# Patient Record
Sex: Female | Born: 2000 | Race: White | Hispanic: No | Marital: Single | Smoking: Never smoker
Health system: Southern US, Community
[De-identification: ages and names within clinical notes are randomized; demographics above are authoritative.]

## PROBLEM LIST (undated history)

## (undated) DIAGNOSIS — R519 Headache, unspecified: Secondary | ICD-10-CM

## (undated) DIAGNOSIS — R51 Headache: Secondary | ICD-10-CM

## (undated) HISTORY — DX: Headache: R51

## (undated) HISTORY — DX: Headache, unspecified: R51.9

---
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
## 2023-02-20 ENCOUNTER — Emergency Department: Payer: Self-pay

## 2023-02-20 ENCOUNTER — Other Ambulatory Visit: Payer: Self-pay

## 2023-02-20 ENCOUNTER — Emergency Department
Admission: EM | Admit: 2023-02-20 | Discharge: 2023-02-20 | Disposition: A | Discharge status: Home / Self Care | Payer: Self-pay | Attending: Emergency Medicine | Admitting: Emergency Medicine

## 2023-02-20 DIAGNOSIS — M79605 Pain in left leg: Secondary | ICD-10-CM | POA: Diagnosis present

## 2023-02-20 DIAGNOSIS — Y990 Civilian activity done for income or pay: Secondary | ICD-10-CM | POA: Diagnosis not present

## 2023-02-20 DIAGNOSIS — W228XXA Striking against or struck by other objects, initial encounter: Secondary | ICD-10-CM | POA: Diagnosis not present

## 2023-02-20 DIAGNOSIS — Z23 Encounter for immunization: Secondary | ICD-10-CM | POA: Diagnosis not present

## 2023-02-20 DIAGNOSIS — S80812A Abrasion, left lower leg, initial encounter: Secondary | ICD-10-CM | POA: Diagnosis not present

## 2023-02-20 DIAGNOSIS — S8992XA Unspecified injury of left lower leg, initial encounter: Secondary | ICD-10-CM

## 2023-02-20 MED ORDER — TETANUS-DIPHTH-ACELL PERTUSSIS 5-2.5-18.5 LF-MCG/0.5 IM SUSY
0.5000 mL | PREFILLED_SYRINGE | Freq: Once | INTRAMUSCULAR | Status: AC
  Administered 2023-02-20: 0.5 mL via INTRAMUSCULAR
  Filled 2023-02-20: qty 0.5

## 2023-02-20 NOTE — ED Provider Notes (Signed)
[facility] Provider Note    Event Date/Time   First MD Initiated Contact with Patient 02/20/23 2208     (approximate)   History   Leg Injury and Workers Comp   HPI Shiela Zavalza is a 22 y.o. female presenting today for left leg pain.  Patient states being at work when she accidentally stepped off a trailer while holding a hay barrel and hit her left shin on an object.  She noted an abrasion with pain to the middle of her left shin.  Denies injury elsewhere.  No numbness or tingling.  Has been able to walk with a limp on it.  Concern for fracture.     Physical Exam   Triage Vital Signs: ED Triage Vitals  Encounter Vitals Group     BP 02/20/23 2057 122/84     Systolic BP Percentile --      Diastolic BP Percentile --      Pulse Rate 02/20/23 2057 82     Resp 02/20/23 2057 17     Temp 02/20/23 2057 98.2 F (36.8 C)     Temp Source 02/20/23 2057 Oral     SpO2 02/20/23 2057 97 %     Weight 02/20/23 2054 155 lb (70.3 kg)     Height 02/20/23 2054 5\' 3"  (1.6 m)     Head Circumference --      Peak Flow --      Pain Score 02/20/23 2054 4     Pain Loc --      Pain Education --      Exclude from Growth Chart --     Most recent vital signs: Vitals:   02/20/23 2057  BP: 122/84  Pulse: 82  Resp: 17  Temp: 98.2 F (36.8 C)  SpO2: 97%   I have reviewed the vital signs. General:  Awake, alert, no acute distress. Head:  Normocephalic, Atraumatic. EENT:  PERRL, EOMI, Oral mucosa pink and moist, Neck is supple. Cardiovascular: Regular rate, 2+ distal pulses. Respiratory:  Normal respiratory effort, symmetrical expansion, no distress.   Extremities:  Moving all four extremities through full ROM without pain.  Mild mid tibia edema with slight tenderness to palpation Neuro:  Alert and oriented.  Interacting appropriately.   Skin:  Warm, dry, no rash.  Abrasion to mid left tibia with some surrounding soft tissue skin abrasion.  No open  lacerations. Psych: Appropriate affect.    ED Results / Procedures / Treatments   Labs (all labs ordered are listed, but only abnormal results are displayed) Labs Reviewed - No data to display   EKG    RADIOLOGY Independently interpreted x-ray of left tibia with no acute fractures   PROCEDURES:  Critical Care performed: No  Procedures   MEDICATIONS ORDERED IN ED: Medications  Tdap (BOOSTRIX) injection 0.5 mL (0.5 mLs Intramuscular Given 02/20/23 2237)     IMPRESSION / MDM / ASSESSMENT AND PLAN / ED COURSE  I reviewed the triage vital signs and the nursing notes.                              Differential diagnosis includes, but is not limited to, left shin abrasion, less likely mid tibial fracture  Patient's presentation is most consistent with acute complicated illness / injury requiring diagnostic workup.  Patient is a 22 year old female presenting today for fall with left leg injury.  Evidence of abrasion to the left shin with  some tibial edema but no obvious deformity.  No other injury elsewhere.  Neurovascular intact throughout.  She is unsure of her last tetanus shot do not see in our system so tetanus shot was updated today.  X-ray performed of the left tibia.  No acute evidence of fracture noted.     FINAL CLINICAL IMPRESSION(S) / ED DIAGNOSES   Final diagnoses:  Abrasion of left lower extremity, initial encounter  Injury of left lower extremity, initial encounter     Rx / DC Orders   ED Discharge Orders     None        Note:  This document was prepared using Dragon voice recognition software and may include unintentional dictation errors.   Janith Lima, MD 02/20/23 [phone]

## 2023-02-20 NOTE — ED Notes (Signed)
Pt to ED related to [facility] injury. Supplies gathered, process explained to PT, pt presented her ID, pt gave verbal consent and paper work was completed. Hand hygiene performed prior to donning gloves. Pt instructed to take the swab and place under her tongue and not to chew, suck or talk with swab in her mouth. Once blue line was visualized, I donned a clean set of gloves and removed swab from PT mouth and placed it in the specimen container. Swab was placed in the bag with the chain of custody form and sealed. 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

## 2023-02-20 NOTE — ED Triage Notes (Signed)
Pt to ED via POV c/o left lower leg injury. Pt was walking off trailer when she fell between trailer and loading platform, putting all her weight on lkeft leg. Pt has some bruising, swelling, and abrasion to left shin. Pt able to bear weight. Wanting to file WC.

## 2023-02-20 NOTE — ED Notes (Signed)
Patient given discharge instructions including importance of follow up appt as needed with stated understanding. Patient stable and ambulatory with steady even gait on dispo.

## 2023-02-20 NOTE — Discharge Instructions (Addendum)
Please return for any worsening symptoms or inability to stand on the leg.
